# Patient Record
Sex: Male | Born: 1955 | Hispanic: Yes | Marital: Single | Smoking: Never smoker
Health system: Southern US, Community
[De-identification: ages and names within clinical notes are randomized; demographics above are authoritative.]

## PROBLEM LIST (undated history)

## (undated) DIAGNOSIS — E119 Type 2 diabetes mellitus without complications: Secondary | ICD-10-CM

---

## 2013-02-21 ENCOUNTER — Inpatient Hospital Stay (HOSPITAL_COMMUNITY)
Admission: EM | Admit: 2013-02-21 | Discharge: 2013-02-23 | DRG: 639 | Disposition: A | Discharge status: Home / Self Care | Payer: Self-pay | Attending: Infectious Disease | Admitting: Infectious Disease

## 2013-02-21 ENCOUNTER — Encounter (HOSPITAL_COMMUNITY): Payer: Self-pay | Admitting: Emergency Medicine

## 2013-02-21 ENCOUNTER — Emergency Department (INDEPENDENT_AMBULATORY_CARE_PROVIDER_SITE_OTHER)
Admission: EM | Admit: 2013-02-21 | Discharge: 2013-02-21 | Disposition: A | Discharge status: Home / Self Care | Payer: Self-pay | Source: Home / Self Care | Attending: Emergency Medicine | Admitting: Emergency Medicine

## 2013-02-21 ENCOUNTER — Emergency Department (INDEPENDENT_AMBULATORY_CARE_PROVIDER_SITE_OTHER): Payer: Self-pay

## 2013-02-21 DIAGNOSIS — L989 Disorder of the skin and subcutaneous tissue, unspecified: Secondary | ICD-10-CM | POA: Diagnosis present

## 2013-02-21 DIAGNOSIS — L03115 Cellulitis of right lower limb: Secondary | ICD-10-CM | POA: Diagnosis present

## 2013-02-21 DIAGNOSIS — E1169 Type 2 diabetes mellitus with other specified complication: Secondary | ICD-10-CM

## 2013-02-21 DIAGNOSIS — E1165 Type 2 diabetes mellitus with hyperglycemia: Secondary | ICD-10-CM | POA: Diagnosis present

## 2013-02-21 DIAGNOSIS — IMO0002 Reserved for concepts with insufficient information to code with codable children: Principal | ICD-10-CM | POA: Diagnosis present

## 2013-02-21 DIAGNOSIS — IMO0001 Reserved for inherently not codable concepts without codable children: Secondary | ICD-10-CM

## 2013-02-21 DIAGNOSIS — L97509 Non-pressure chronic ulcer of other part of unspecified foot with unspecified severity: Secondary | ICD-10-CM

## 2013-02-21 DIAGNOSIS — L02619 Cutaneous abscess of unspecified foot: Secondary | ICD-10-CM

## 2013-02-21 DIAGNOSIS — E119 Type 2 diabetes mellitus without complications: Secondary | ICD-10-CM

## 2013-02-21 DIAGNOSIS — S98919A Complete traumatic amputation of unspecified foot, level unspecified, initial encounter: Secondary | ICD-10-CM

## 2013-02-21 DIAGNOSIS — L97409 Non-pressure chronic ulcer of unspecified heel and midfoot with unspecified severity: Secondary | ICD-10-CM

## 2013-02-21 DIAGNOSIS — L0390 Cellulitis, unspecified: Secondary | ICD-10-CM

## 2013-02-21 DIAGNOSIS — L089 Local infection of the skin and subcutaneous tissue, unspecified: Secondary | ICD-10-CM

## 2013-02-21 DIAGNOSIS — Z794 Long term (current) use of insulin: Secondary | ICD-10-CM

## 2013-02-21 DIAGNOSIS — R739 Hyperglycemia, unspecified: Secondary | ICD-10-CM

## 2013-02-21 DIAGNOSIS — E11628 Type 2 diabetes mellitus with other skin complications: Secondary | ICD-10-CM

## 2013-02-21 DIAGNOSIS — E11621 Type 2 diabetes mellitus with foot ulcer: Secondary | ICD-10-CM

## 2013-02-21 DIAGNOSIS — L03119 Cellulitis of unspecified part of limb: Secondary | ICD-10-CM

## 2013-02-21 HISTORY — DX: Type 2 diabetes mellitus without complications: E11.9

## 2013-02-21 LAB — COMPREHENSIVE METABOLIC PANEL
ALT: 15 U/L (ref 0–53)
AST: 14 U/L (ref 0–37)
Albumin: 3.5 g/dL (ref 3.5–5.2)
Alkaline Phosphatase: 116 U/L (ref 39–117)
BILIRUBIN TOTAL: 0.4 mg/dL (ref 0.3–1.2)
BUN: 19 mg/dL (ref 6–23)
CHLORIDE: 99 meq/L (ref 96–112)
CO2: 24 meq/L (ref 19–32)
CREATININE: 0.88 mg/dL (ref 0.50–1.35)
Calcium: 9.4 mg/dL (ref 8.4–10.5)
GFR calc Af Amer: >90 mL/min (ref >90)
Glucose, Bld: 438 mg/dL — ABNORMAL HIGH (ref 70–99)
Potassium: 4.4 mEq/L (ref 3.7–5.3)
Sodium: 138 mEq/L (ref 137–147)
Total Protein: 7.5 g/dL (ref 6.0–8.3)

## 2013-02-21 LAB — CBC WITH DIFFERENTIAL/PLATELET
Basophils Absolute: 0 10*3/uL (ref 0.0–0.1)
Basophils Relative: 0 % (ref 0–1)
Eosinophils Absolute: 0.1 10*3/uL (ref 0.0–0.7)
Eosinophils Relative: 1 % (ref 0–5)
HEMATOCRIT: 40.7 % (ref 39.0–52.0)
HEMOGLOBIN: 14.4 g/dL (ref 13.0–17.0)
LYMPHS PCT: 19 % (ref 12–46)
Lymphs Abs: 1.6 10*3/uL (ref 0.7–4.0)
MCH: 29.4 pg (ref 26.0–34.0)
MCHC: 35.4 g/dL (ref 30.0–36.0)
MCV: 83.2 fL (ref 78.0–100.0)
MONO ABS: 0.4 10*3/uL (ref 0.1–1.0)
MONOS PCT: 4 % (ref 3–12)
Neutro Abs: 6 10*3/uL (ref 1.7–7.7)
Neutrophils Relative %: 75 % (ref 43–77)
Platelets: 257 10*3/uL (ref 150–400)
RBC: 4.89 MIL/uL (ref 4.22–5.81)
RDW: 12.9 % (ref 11.5–15.5)
WBC: 8.1 10*3/uL (ref 4.0–10.5)

## 2013-02-21 LAB — POCT I-STAT, CHEM 8
BUN: 20 mg/dL (ref 6–23)
CALCIUM ION: 1.23 mmol/L (ref 1.12–1.23)
CHLORIDE: 100 meq/L (ref 96–112)
Creatinine, Ser: 1 mg/dL (ref 0.50–1.35)
Glucose, Bld: 469 mg/dL — ABNORMAL HIGH (ref 70–99)
HCT: 46 % (ref 39.0–52.0)
Hemoglobin: 15.6 g/dL (ref 13.0–17.0)
Potassium: 4.5 mEq/L (ref 3.7–5.3)
Sodium: 139 mEq/L (ref 137–147)
TCO2: 27 mmol/L (ref 0–100)

## 2013-02-21 LAB — GLUCOSE, CAPILLARY
GLUCOSE-CAPILLARY: 371 mg/dL — AB (ref 70–99)
GLUCOSE-CAPILLARY: 155 mg/dL — AB (ref 70–99)
Glucose-Capillary: 355 mg/dL — ABNORMAL HIGH (ref 70–99)
Glucose-Capillary: 321 mg/dL — ABNORMAL HIGH (ref 70–99)
Glucose-Capillary: 95 mg/dL (ref 70–99)

## 2013-02-21 LAB — HEMOGLOBIN A1C
HEMOGLOBIN A1C: 11.2 % — AB (ref <5.7)
MEAN PLASMA GLUCOSE: 275 mg/dL — AB (ref <117)

## 2013-02-21 MED ORDER — ENOXAPARIN SODIUM 40 MG/0.4ML ~~LOC~~ SOLN
40.0000 mg | SUBCUTANEOUS | Status: DC
  Administered 2013-02-21 – 2013-02-22 (×2): 40 mg via SUBCUTANEOUS
  Filled 2013-02-21 (×3): qty 0.4

## 2013-02-21 MED ORDER — SODIUM CHLORIDE 0.9 % IV BOLUS (SEPSIS)
1000.0000 mL | Freq: Once | INTRAVENOUS | Status: AC
  Administered 2013-02-21: 1000 mL via INTRAVENOUS

## 2013-02-21 MED ORDER — LEVOFLOXACIN IN D5W 750 MG/150ML IV SOLN
750.0000 mg | Freq: Once | INTRAVENOUS | Status: AC
  Administered 2013-02-21: 750 mg via INTRAVENOUS
  Filled 2013-02-21: qty 150

## 2013-02-21 MED ORDER — INSULIN ASPART 100 UNIT/ML ~~LOC~~ SOLN
12.0000 [IU] | Freq: Once | SUBCUTANEOUS | Status: AC
  Administered 2013-02-21: 12 [IU] via SUBCUTANEOUS
  Filled 2013-02-21: qty 1

## 2013-02-21 MED ORDER — INSULIN ASPART 100 UNIT/ML ~~LOC~~ SOLN
0.0000 [IU] | Freq: Three times a day (TID) | SUBCUTANEOUS | Status: DC
  Administered 2013-02-22: 3 [IU] via SUBCUTANEOUS

## 2013-02-21 MED ORDER — INSULIN GLARGINE 100 UNIT/ML ~~LOC~~ SOLN
15.0000 [IU] | Freq: Every day | SUBCUTANEOUS | Status: DC
  Administered 2013-02-21 – 2013-02-22 (×2): 15 [IU] via SUBCUTANEOUS
  Filled 2013-02-21 (×3): qty 0.15

## 2013-02-21 NOTE — Discharge Instructions (Signed)
We have determined that your problem requires further evaluation in the emergency department.  We will take care of your transport there.  Once at the emergency department, you will be evaluated by a provider and they will order whatever treatment or tests they deem necessary.  We cannot guarantee that they will do any specific test or do any specific treatment.  ° °

## 2013-02-21 NOTE — ED Provider Notes (Signed)
Chief Complaint   Chief Complaint  Patient presents with  . Leg Pain    History of Present Illness   Eric Waller is a 58 year old Hispanic male who speaks little English and the history was obtained with the help of a telephone interpreter. He has just moved here from [place] about a month ago and does not have a primary care physician in the area. He has had a history of diabetes and takes insulin twice a day. He's not sure what kind of insulin he takes or the dose. He thinks it's 5 units twice a day. The past week he's had a painful, ulcerated lesion on his left little toe. This is been draining some fluid. He's felt chilled but not had any fever. He denies any trauma to the area. 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  Review of Systems   Other than as noted above, the patient denies any of the following symptoms: Systemic:  No fevers, chills, or sweats.  No fatigue or tiredness. Musculoskeletal:  No joint pain, arthritis, bursitis, swelling, or back pain.  Neurological:  No muscular weakness, paresthesias.   PMFSH   Past medical history, family history, social history, meds, and allergies were reviewed.   He takes insulin for his diabetes. He thinks his sugars have been under good control.  Physical  Examination     Vital signs:  BP 123/83  Pulse 82  Temp(Src) 97.8 F (36.6 C) (Oral)  Resp 16  SpO2 100% Gen:  Alert and oriented times 3.  In no distress. Musculoskeletal:  Exam of the foot reveals exam of the foot reveals a small ulcer at the base of the right little toe with some surrounding blistering and slight erythema. This is nontender to palpation. There is some serosanguineous drainage. The dorsum of the foot is slightly swollen. Pedal pulses were not palpable.  Otherwise, all joints had a full a ROM with no swelling, bruising or deformity.  No edema, pulses full. Extremities were warm and pink.  Capillary  refill was brisk.  Skin:  Clear, warm and dry.  No rash. Neuro:  Alert and oriented times 3.  Muscle strength was normal.  Sensation was intact to light touch.    Labs   Results for orders placed during the hospital encounter of 02/21/13  POCT I-STAT, CHEM 8      Result Value Range   Sodium 139  137 - 147 mEq/L   Potassium 4.5  3.7 - 5.3 mEq/L   Chloride 100  96 - 112 mEq/L   BUN 20  6 - 23 mg/dL   Creatinine, Ser 4.091.00  0.50 - 1.35 mg/dL   Glucose, Bld 811469 (*) 70 - 99 mg/dL   Calcium, Ion 9.141.23  7.821.12 - 1.23 mmol/L   TCO2 27  0 - 100 mmol/L   Hemoglobin 15.6  13.0 - 17.0 g/dL   HCT 95.646.0  21.339.0 - 08.652.0 %   Radiology   Dg Foot Complete Right  02/21/2013   CLINICAL DATA:  Infected ulcer.  EXAM: RIGHT FOOT COMPLETE - 3+ VIEW  COMPARISON:  None.  FINDINGS: Small bubbles of subcutaneous gas noted lateral to the proximal interphalangeal joint little toe. No cortical destruction or other bone abnormality. The DIP joint is fused, an anatomic variant. Normal alignment and mineralization. Extensive arterial vascular calcifications. No fracture. Small calcaneal spur.  IMPRESSION: 1. Subcutaneous gas lateral to the little toe PIP joint,  without associated bone abnormality.   Electronically Signed   By: Oley Balm M.D.   On: 02/21/2013 12:15   I reviewed the images independently and personally and concur with the radiologist's findings.  Assessment   The encounter diagnosis was Diabetic foot ulcer.  He has a diabetic foot ulcer, but what concerns me is his diabetes is not well controlled, he has no primary care support in town here, and his x-ray shows subcutaneous gas, concerning for gangrenous infection. He already has lost all the toes on his left foot, and I'm concerned that the same thing might happen on his right foot.  Plan   The patient was transferred to the ED via shuttle in stable condition.  Medical Decision Making     58 year old Hispanic male with diabetes presents with 1  week history of an infected ulcer on his right little toe.  He  Has not had much pain.  On exam, pulses are absent.  He has a blister and an ulcer on right little toe with surrounding erythema and sero-sanguinous drainage.  A culture has been obtained.  His x-ray show gas in the soft tissue and a glucose of 496 on iStat 8.  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  Will send by shuttle.      Reuben Likes, MD 02/21/13 [phone]

## 2013-02-21 NOTE — ED Provider Notes (Addendum)
CSN: [id number]     Arrival date & time 02/21/13  1333 History   First MD Initiated Contact with Patient 02/21/13 1352     Chief Complaint  Patient presents with  . Recurrent Skin Infections   (Consider location/radiation/quality/duration/timing/severity/associated sxs/prior Treatment) The history is provided by the patient.  pt w hx iddm, presents from urgent care w blister/wound to dorsal/lateral aspect right 5th toe for the past few days. Symptoms constant, localized, without specific exacerbating or alleviating factors.  Was also noted to have blood glucose in 400s at urgent care. No nausea/vomiting. States compliant w his insulin rx, but isnt sure of type or dose. No fever or chills. No spreading redness up foot or leg.      Past Medical History  Diagnosis Date  . Diabetes mellitus without complication    History reviewed. No pertinent past surgical history. History reviewed. No pertinent family history. History  Substance Use Topics  . Smoking status: Never Smoker   . Smokeless tobacco: Not on file  . Alcohol Use: No    Review of Systems  Constitutional: Negative for fever.  HENT: Negative for sore throat.   Eyes: Negative for redness.  Respiratory: Negative for shortness of breath.   Cardiovascular: Negative for chest pain.  Gastrointestinal: Negative for vomiting and abdominal pain.  Genitourinary: Negative for flank pain.  Musculoskeletal: Negative for back pain and neck pain.  Skin: Negative for rash.  Neurological: Negative for headaches.  Hematological: Does not bruise/bleed easily.  Psychiatric/Behavioral: Negative for confusion.    Allergies  Review of patient's allergies indicates no known allergies.  Home Medications  No current outpatient prescriptions on file. BP 164/92  Pulse 80  Temp(Src) 97.8 F (36.6 C) (Oral)  Resp 16  Wt 137 lb 9.6 oz (62.415 kg)  SpO2 98% Physical Exam  Nursing note and vitals reviewed. Constitutional: He appears  well-developed and well-nourished. No distress.  HENT:  Head: Atraumatic.  Mouth/Throat: Oropharynx is clear and moist.  Eyes: Pupils are equal, round, and reactive to light.  Neck: Neck supple. No tracheal deviation present.  Cardiovascular: Normal rate, regular rhythm, normal heart sounds and intact distal pulses.   Pulmonary/Chest: Effort normal and breath sounds normal. No accessory muscle usage. No respiratory distress.  Abdominal: Soft. Bowel sounds are normal. He exhibits no distension. There is no tenderness.  Musculoskeletal: Normal range of motion. He exhibits no edema.  Superficial ruptured/broken blister to dorsal/lat aspect right 5th toe w mild surrounding erythema. No pus. No crepitus. Normal cap refill distally. Dp/pt palp. No necrotic/gangrenous changes to toe or foot.   Neurological: He is alert.  Skin: Skin is warm and dry. He is not diaphoretic.  Psychiatric: He has a normal mood and affect.    ED Course  Procedures (including critical care time)   I-STAT, chem 8   Status: Final result Visible to patient: This result is not viewable by the patient. Next appt: None           Range 12:16 PM    Sodium 137 - 147 mEq/L 139     Potassium 3.7 - 5.3 mEq/L 4.5     Chloride 96 - 112 mEq/L 100     BUN 6 - 23 mg/dL 20     Creatinine, Ser 0.50 - 1.35 mg/dL 1.19     Glucose, Bld 70 - 99 mg/dL 147 (H)     Calcium, Ion 1.12 - 1.23 mmol/L 1.23     TCO2 0 - 100 mmol/L 27  Hemoglobin 13.0 - 17.0 g/dL 82.915.6     HCT 56.239.0 - 13.052.0 % 46.0     Resulting Agency        Results for orders placed during the hospital encounter of 02/21/13  GLUCOSE, CAPILLARY      Result Value Range   Glucose-Capillary 371 (*) 70 - 99 mg/dL   Dg Foot Complete Right  02/21/2013   CLINICAL DATA:  Infected ulcer.  EXAM: RIGHT FOOT COMPLETE - 3+ VIEW  COMPARISON:  None.  FINDINGS: Small bubbles of subcutaneous gas noted lateral to the proximal interphalangeal joint little toe. No cortical  destruction or other bone abnormality. The DIP joint is fused, an anatomic variant. Normal alignment and mineralization. Extensive arterial vascular calcifications. No fracture. Small calcaneal spur.  IMPRESSION: 1. Subcutaneous gas lateral to the little toe PIP joint, without associated bone abnormality.   Electronically Signed   By: Oley Balmaniel  Hassell M.D.   On: 02/21/2013 12:15      EKG Interpretation   None       MDM  Iv ns bolus. levaquin iv.  Reviewed xr from [facility]  -  Pt has an open/ruptured blister on toe in area of tiny amt subcut air (i.e. Feel likely that subcut air noted on xr is from environmental source, as opposed to be generated for aggressive soft tissue infxn).   There is no gangrenous changes to foot or toe, but rather ruptured blister w surrounding erythema.  Erythema extends up dorsum foot w streaking to ankle.   Given infected diabetic foot wound, cellulitis, uncontrolled diabetes, w very limited local resources for home care, abx, management of diabetes, follow up, etc, med service called to admit for iv abx.  Insulin sq. Additional ivf.  Discussed w teaching service resident, will admit.      Suzi RootsKevin E Amila Callies, MD 02/21/13 [phone]

## 2013-02-21 NOTE — ED Notes (Addendum)
[facility] of diabetic ulcer to r foot and hyperglycemia. Labs, xray and wound culture were obtained at [facility]

## 2013-02-21 NOTE — ED Notes (Signed)
CBG Taken = 371.

## 2013-02-21 NOTE — H&P (Signed)
Date: 02/21/2013               Patient Name:  Eric Waller A Ostrosky MRN: [id number]  DOB: 10/08/1955 Age / Sex: 58 y.o., male   PCP: No primary provider on file.         Medical Service: Internal Medicine Teaching Service         Attending Physician: Dr. Inez CatalinaEmily B Mullen, MD    First Contact: Dr. Alvina ChouJacqueline Gill Pager: [phone]  Second Contact: Dr. Dow Adolphichard Kazibwe Pager: [phone]       After Hours (After 5p/  First Contact Pager: [phone]  weekends / holidays): Second Contact Pager: [phone]   Chief Complaint: Foot ulcer  History of Present Illness:  Eric Waller is a 58 y.o. Spanish-speaking male PMH IDDM, recently moved to [place], who presents to the ED with a chief complaint of foot ulcer. A telephone translator was used.  Patient has a blister on the dorsolateral aspect of his right fifth toe. He says it has been present for the past few days. He had ulcer like this once before, on his left foot, about 5 months ago after he stepped on a nail. 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  He tells me he has had diabetes for about 10 years. He has never been hospitalized for DKA. He says he is compliant with his insulin prescription, and that this is his only medication. He tells me he takes 5 units twice a day but does not know what kind of insulin it is. He injects into his thighs. When I asked him who was prescribing him his insulin, he said [facility] in [place]. He moved to [place] about one month ago. His son lives here. He does not have a primary care doctor. He used to work [employer] in [place], but has not worked for 8 months. He does not smoke, drink, or use drugs.  Patient denies fevers, chills, abdominal pain, nausea, vomiting, chest pain, shortness of breath.  In the ED, he was given NovoLog 12 units subq, Levaquin 750 mg IV x1, and a 1 L normal saline fluid  bolus   Meds: No current facility-administered medications for this encounter.   No current outpatient prescriptions on file.    Allergies: Allergies as of 02/21/2013  . (No Known Allergies)   Past Medical History  Diagnosis Date  . Diabetes mellitus without complication    History reviewed. No pertinent past surgical history. History reviewed. No pertinent family history. History   Social History  . Marital Status: Single    Spouse Name: N/A    Number of Children: N/A  . Years of Education: N/A   Occupational History  . Not on file.   Social History Main Topics  . Smoking status: Never Smoker   . Smokeless tobacco: Not on file  . Alcohol Use: No  . Drug Use: No  . Sexual Activity: Not on file   Other Topics Concern  . Not on file   Social History Narrative  . No narrative on file    Review of Systems: Pertinent items are noted in HPI.  Physical Exam: Blood pressure 136/75, pulse 73, temperature 97.8 F (36.6 C), temperature source Oral, resp. rate 16, weight 137 lb 9.6 oz (62.415 kg), SpO2 99.00%. Physical Exam  Constitutional: He is oriented to person, place, and time and well-developed, well-nourished, and in no distress.  HENT:  Head: Normocephalic and atraumatic.  Eyes: Conjunctivae and EOM are normal. Pupils are equal, round, and reactive to light.  Neck: Normal range of motion. Neck supple.  Cardiovascular: Normal rate, regular rhythm, normal heart sounds and intact distal pulses.  Exam reveals no gallop and no friction rub.   No murmur heard. Pulmonary/Chest: Effort normal and breath sounds normal. No respiratory distress. He has no wheezes. He has no rales. He exhibits no tenderness.  Abdominal: Soft. Bowel sounds are normal. He exhibits no distension. There is no tenderness.  Musculoskeletal: Normal range of motion. He exhibits no edema and no tenderness.  Neurological: He is alert and oriented to person, place, and time. GCS score is 15.  Skin:  Skin is warm and dry. Lesion (Multiple dark skin lesions on legs and chest/shoulders) noted.     Psychiatric: Affect normal.     Lab results: Basic Metabolic Panel:  Recent Labs  16/10/96 1216 02/21/13 1446  NA 139 138  K 4.5 4.4  CL 100 99  CO2  --  24  GLUCOSE 469* 438*  BUN 20 19  CREATININE 1.00 0.88  CALCIUM  --  9.4   Liver Function Tests:  Recent Labs  02/21/13 1446  AST 14  ALT 15  ALKPHOS 116  BILITOT 0.4  PROT 7.5  ALBUMIN 3.5   No results found for this basename: LIPASE, AMYLASE,  in the last 72 hours No results found for this basename: AMMONIA,  in the last 72 hours CBC:  Recent Labs  02/21/13 1216 02/21/13 1446  WBC  --  8.1  NEUTROABS  --  6.0  HGB 15.6 14.4  HCT 46.0 40.7  MCV  --  83.2  PLT  --  257   Cardiac Enzymes: No results found for this basename: CKTOTAL, CKMB, CKMBINDEX, TROPONINI,  in the last 72 hours BNP: No results found for this basename: PROBNP,  in the last 72 hours D-Dimer: No results found for this basename: DDIMER,  in the last 72 hours CBG:  Recent Labs  02/21/13 1345 02/21/13 1515 02/21/13 1613  GLUCAP 371* 355* 321*   Hemoglobin A1C: No results found for this basename: HGBA1C,  in the last 72 hours Fasting Lipid Panel: No results found for this basename: CHOL, HDL, LDLCALC, TRIG, CHOLHDL, LDLDIRECT,  in the last 72 hours Thyroid Function Tests: No results found for this basename: TSH, T4TOTAL, FREET4, T3FREE, THYROIDAB,  in the last 72 hours Anemia Panel: No results found for this basename: VITAMINB12, FOLATE, FERRITIN, TIBC, IRON, RETICCTPCT,  in the last 72 hours Coagulation: No results found for this basename: LABPROT, INR,  in the last 72 hours Urine Drug Screen: Drugs of Abuse  No results found for this basename: labopia,  cocainscrnur,  labbenz,  amphetmu,  thcu,  labbarb    Alcohol Level: No results found for this basename: ETH,  in the last 72 hours Urinalysis: No results found for this  basename: COLORURINE, APPERANCEUR, LABSPEC, PHURINE, GLUCOSEU, HGBUR, BILIRUBINUR, KETONESUR, PROTEINUR, UROBILINOGEN, NITRITE, LEUKOCYTESUR,  in the last 72 hours   Imaging results:  Dg Foot Complete Right  02/21/2013   CLINICAL DATA:  Infected ulcer.  EXAM: RIGHT FOOT COMPLETE - 3+ VIEW  COMPARISON:  None.  FINDINGS: Small bubbles of subcutaneous gas noted lateral to the proximal interphalangeal joint little toe. No cortical destruction or other bone abnormality. The DIP joint is fused, an anatomic variant. Normal alignment and mineralization. Extensive arterial vascular calcifications. No fracture. Small calcaneal spur.  IMPRESSION:  1. Subcutaneous gas lateral to the little toe PIP joint, without associated bone abnormality.   Electronically Signed   By: Oley Balm M.D.   On: 02/21/2013 12:15    Other results: EKG: Not performed.  Assessment & Plan by Problem: Jenesis A Talton is a 58 y.o. Spanish-speaking male PMH IDDM, recently moved to [place], who presents to the ED with a chief complaint of foot ulcer.  #Diabetic foot ulcer with mild severity infection - Minimal erythema, no induration or discharge. Extension through skin only. Do not suspect osteomyelitis. No signs of systemic infection. WBC 8.1, no left shift. Patient is afebrile, all vital signs stable. Subcutaneous air was noted on XR, associated with the blister. Based on our exam and his well-appearing clinical picture, this air is likely from an environmental source instead of necrotizing fasciitis/etc, but we will monitor him closely for this.  - Admit to IMTS, med-surg - Changing antibiotic regimen to Clindamycin 300mg  po q8h, doxycycline 100mg  po BID for empiric treatment of mild diabetic foot infection, this covers streptococci and MRSA  - Wound care consult - Carb modified diet - CMP, CBC in am  #IDDM - Likely type 2 diabetes given adult onset. 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 I attempted to  obtain records from [facility]. They're on Care Everywhere, but no matching patients were found when I queried his information. We can try to fax a requisition form to their medical records department at fax# [phone]. I called and they are open on the weekend. - Obtain records from [facility] (nursing order placed) - Hemoglobin A1c - Social work consult to assist with limited resources - Care management consult for medication needs and followup - Diabetes educator consult  #Skin lesions - Patient with multiple, heterogenous lesions on skin. He denies insect bites. - HIV antibody  #DVT PXX - Lovenox subcutaneous  Dispo: Disposition is deferred at this time, awaiting improvement of current medical problems. Anticipated discharge in approximately 1-3 day(s).   The patient does not have a current PCP (No primary provider on file.) and does not need an [facility] hospital follow-up appointment after discharge.  The patient does not have transportation limitations that hinder transportation to clinic appointments.  Signed: Vivi Barrack, MD 02/21/2013, 4:52 PM

## 2013-02-21 NOTE — ED Notes (Signed)
C/o right pinky toe ulcer due to shoe rubbing up against his foot

## 2013-02-22 DIAGNOSIS — IMO0001 Reserved for inherently not codable concepts without codable children: Secondary | ICD-10-CM

## 2013-02-22 DIAGNOSIS — E1165 Type 2 diabetes mellitus with hyperglycemia: Secondary | ICD-10-CM

## 2013-02-22 LAB — CBC
HCT: 38.8 % — ABNORMAL LOW (ref 39.0–52.0)
Hemoglobin: 13.4 g/dL (ref 13.0–17.0)
MCH: 28.9 pg (ref 26.0–34.0)
MCHC: 34.5 g/dL (ref 30.0–36.0)
MCV: 83.6 fL (ref 78.0–100.0)
Platelets: 239 10*3/uL (ref 150–400)
RBC: 4.64 MIL/uL (ref 4.22–5.81)
RDW: 13 % (ref 11.5–15.5)
WBC: 8.4 10*3/uL (ref 4.0–10.5)

## 2013-02-22 LAB — COMPREHENSIVE METABOLIC PANEL
ALT: 13 U/L (ref 0–53)
AST: 14 U/L (ref 0–37)
Albumin: 2.8 g/dL — ABNORMAL LOW (ref 3.5–5.2)
Alkaline Phosphatase: 98 U/L (ref 39–117)
BUN: 21 mg/dL (ref 6–23)
CALCIUM: 8.7 mg/dL (ref 8.4–10.5)
CO2: 23 meq/L (ref 19–32)
CREATININE: 0.9 mg/dL (ref 0.50–1.35)
Chloride: 104 mEq/L (ref 96–112)
GFR calc Af Amer: >90 mL/min (ref >90)
Glucose, Bld: 254 mg/dL — ABNORMAL HIGH (ref 70–99)
Potassium: 3.9 mEq/L (ref 3.7–5.3)
Sodium: 140 mEq/L (ref 137–147)
Total Bilirubin: 0.3 mg/dL (ref 0.3–1.2)
Total Protein: 6.6 g/dL (ref 6.0–8.3)

## 2013-02-22 LAB — GLUCOSE, CAPILLARY
GLUCOSE-CAPILLARY: 102 mg/dL — AB (ref 70–99)
GLUCOSE-CAPILLARY: 346 mg/dL — AB (ref 70–99)
GLUCOSE-CAPILLARY: 91 mg/dL (ref 70–99)
Glucose-Capillary: 215 mg/dL — ABNORMAL HIGH (ref 70–99)

## 2013-02-22 LAB — HIV ANTIBODY (ROUTINE TESTING W REFLEX): HIV: NONREACTIVE

## 2013-02-22 MED ORDER — INSULIN ASPART 100 UNIT/ML ~~LOC~~ SOLN
0.0000 [IU] | Freq: Three times a day (TID) | SUBCUTANEOUS | Status: DC
  Administered 2013-02-22: 11 [IU] via SUBCUTANEOUS

## 2013-02-22 MED ORDER — CLINDAMYCIN HCL 300 MG PO CAPS
300.0000 mg | ORAL_CAPSULE | Freq: Three times a day (TID) | ORAL | Status: DC
  Administered 2013-02-22 – 2013-02-23 (×4): 300 mg via ORAL
  Filled 2013-02-22 (×7): qty 1

## 2013-02-22 MED ORDER — INSULIN ASPART 100 UNIT/ML ~~LOC~~ SOLN
0.0000 [IU] | Freq: Three times a day (TID) | SUBCUTANEOUS | Status: DC
  Administered 2013-02-23: 8 [IU] via SUBCUTANEOUS
  Administered 2013-02-23: 5 [IU] via SUBCUTANEOUS

## 2013-02-22 NOTE — Discharge Summary (Signed)
Name: Eric Waller MRN: [id number] DOB: 03/16/1955 58 y.o. PCP: No primary provider on file. . Date of Admission: 02/21/2013  1:51 PM Date of Discharge: 02/22/2013 Attending Physician: Inez CatalinaEmily B Mullen, MD  Discharge Diagnosis: Principal Problem:   Cellulitis of right foot Active Problems:   Diabetes type 2, uncontrolled  Discharge Medications:   Medication List         clindamycin 300 MG capsule  Commonly known as:  CLEOCIN  Take 1 capsule (300 mg total) by mouth every 8 (eight) hours.     insulin NPH-regular Human (70-30) 100 UNIT/ML injection  Commonly known as:  NOVOLIN 70/30  Inject 15 Units into the skin 2 (two) times daily at 8 am and 10 pm.     Insulin Syringe-Needle U-100 27G X 1/2" 0.5 ML Misc  3 Packages by Does not apply route 2 (two) times daily.        Disposition and follow-up:   [name] Good [facility].  Please address the following problems:  #BP- pt BP elevated and should consider placing on ACE-i. Goal BP<140/90.   Recheck of 5th right toe ulcer  #Labs / imaging needed at time of follow-up: HA1c in 3 months  #Pending labs/ test needing follow-up: None  Follow-up Appointments: Follow-up Information   Please follow up. [facility]  [address]  [place]  [phone]   must register at clinic before scheduled for appointment  Bring proof of income, photo ID, bill with house address )       Discharge Instructions: Discharge Orders   Future Orders Complete By Expires   Call MD for:  difficulty breathing, headache or visual disturbances  As directed    Call MD for:  persistant nausea and vomiting  As directed    Call MD for:  redness, tenderness, or signs of infection (pain, swelling, redness, odor or green/yellow discharge around incision site)  As directed    Call MD for:  severe uncontrolled pain  As directed    Call MD for:  temperature >100.4  As  directed    Diet - low sodium heart healthy  As directed    Increase activity slowly  As directed       Consultations:  None  Procedures Performed:  Dg Foot Complete Right  02/21/2013   CLINICAL DATA:  Infected ulcer.  EXAM: RIGHT FOOT COMPLETE - 3+ VIEW  COMPARISON:  None.  FINDINGS: Small bubbles of subcutaneous gas noted lateral to the proximal interphalangeal joint little toe. No cortical destruction or other bone abnormality. The DIP joint is fused, an anatomic variant. Normal alignment and mineralization. Extensive arterial vascular calcifications. No fracture. Small calcaneal spur.  IMPRESSION: 1. Subcutaneous gas lateral to the little toe PIP joint, without associated bone abnormality.   Electronically Signed   By: Oley Balmaniel  Hassell M.D.   On: 02/21/2013 12:15    2D Echo: N/A  Cardiac Cath: N/A  Admission HPI: Eric Waller is a 58 y.o. Spanish-speaking male PMH IDDM, [place], who presents to the ED with a chief complaint of foot ulcer. A telephone translator was used.  Patient has a blister on the dorsolateral aspect of his right fifth toe. He says it has been present for the past few days. He had ulcer like this once before, on his left foot, about 5 months ago after he stepped on a nail. 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  He tells me he has had diabetes for about 10 years. He has never been hospitalized for DKA. He says he is compliant with his insulin prescription, and that this is his only medication. He tells me he takes 5 units twice a day but does not know what kind of insulin it is. He injects into his thighs. When I asked him who was prescribing him his insulin, he said [facility] in [place]. He moved to [place] about one month ago. His son lives here. He does not have a primary care doctor. He used to work [employer] in [place], but has  not worked for 8 months. He does not smoke, drink, or use drugs.  Patient denies fevers, chills, abdominal pain, nausea, vomiting, chest pain, shortness of breath.   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 Extension through skin only. Do not suspect osteomyelitis. No signs of systemic infection. WBC 8.1, no left shift. Patient is afebrile, all vital signs stable. Subcutaneous air was noted on XR, associated with the blister. Based on our exam and his well-appearing clinical picture, this air is likely from an environmental source instead of necrotizing fasciitis/etc, but we will monitor him closely for this.  Wound care was consulted and provided triple antibiotic daily.  He was started on clindamycin and doxycycline and was discharged on clindamycin for an additional 8 days.    #Uncontrolled DMII - Likely type 2 diabetes given adult onset. 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 I attempted to obtain records from [facility]. They're on Care Everywhere, but no matching patients were found when I queried his information. Hemoglobin A1c 02/21/13 was 11.2. Pt was started on 15 units lantus and SSI. He is unaware of the name of the insulin he is on at home.  Social work assisted in obtaining novolin 70/30 insulin through [employer] program and was sent home to inject 15 units twice daily at 8am and 10pm.  Diabetes educator consulted and provided pt with information and instructions regarding proper insulin use and education about monitoring carb intake.  He should have a recheck of HA1c in 3 months.   #Skin lesions - Patient with multiple, heterogenous lesions on skin. He denies insect  bites. HIV antibody negative.   #DVT PXX - Lovenox subcutaneous   Discharge Vitals:   BP 155/78  Pulse 74  Temp(Src) 98.2 F (36.8 C) (Oral)  Resp 18  Ht 5\' 5"  (1.651 m)  Wt 137 lb 9.1 oz (62.4 kg)  BMI 22.89 kg/m2  SpO2 99%  Discharge Labs:  No results found for this or any previous visit (from the past 24 hour(s)).  Signed: Boykin Peek, MD [phone] 02/26/2013, 9:16 PM   Time Spent on Discharge: Services Ordered on Discharge: None Equipment Ordered on Discharge: None

## 2013-02-22 NOTE — Progress Notes (Signed)
Subjective:   Pt has no new complaints this AM and is feeling better.  He did not have any acute events overnight.  Pt denies any pain, fever/chills, CP, SOB.   Objective:   Vital signs in last 24 hours: Filed Vitals:   02/21/13 2020 02/21/13 2055 02/22/13 0435 02/22/13 0900  BP: 121/72  140/84 129/74  Pulse: 67  79 73  Temp: 97.3 F (36.3 C)  98.1 F (36.7 C) 98 F (36.7 C)  TempSrc: Oral  Oral Oral  Resp: 16  16 19   Height:  5\' 5"  (1.651 m)    Weight:      SpO2: 99%  99% 100%   Weight change:  No intake or output data in the 24 hours ending 02/22/13 1121  Physical Exam: Constitutional: Vital signs reviewed.  Patient is in no acute distress and cooperative with exam.   Head: Normocephalic and atraumatic Eyes: PERRL, EOMI, conjunctivae normal, no scleral icterus  Neck: Supple, Trachea midline Cardiovascular: RRR, S1, S2 present, no MRG, DP 2+ b/l Pulmonary/Chest: normal respiratory effort, CTAB, no wheezes, rales, or rhonchi Abdominal: Thin. Soft. +BS, Non-tender, non-distended Neurological: A&O x3, cranial nerve II-XII are grossly intact, moving all extremities  Extremities: Right fifth toe lateral healing ulcer (0.5cm).  Left foot partial amputation.  Skin: Warm, dry and intact. Diffuse macular (0.5-1cm) round violaceous rash on the LE. Left anterior cervical scabbed over wound.     Psychiatric: Normal mood and affect.   Lab Results:  BMP:  Recent Labs Lab 02/21/13 1446 02/22/13 0500  NA 138 140  K 4.4 3.9  CL 99 104  CO2 24 23  GLUCOSE 438* 254*  BUN 19 21  CREATININE 0.88 0.90  CALCIUM 9.4 8.7    CBC:  Recent Labs Lab 02/21/13 1216 02/21/13 1446 02/22/13 0500  WBC  --  8.1 8.4  NEUTROABS  --  6.0  --   HGB 15.6 14.4 13.4  HCT 46.0 40.7 38.8*  MCV  --  83.2 83.6  PLT  --  257 239    Coagulation: No results found for this basename: LABPROT, INR,  in the last 168 hours  CBG:            Recent Labs Lab 02/21/13 1345 02/21/13 1515  02/21/13 1613 02/21/13 1731 02/21/13 2027 02/22/13 0728  GLUCAP 371* 355* 321* 95 155* 215*           HA1C:       Recent Labs Lab 02/21/13 1446  HGBA1C 11.2*    Lipid Panel: No results found for this basename: CHOL, HDL, LDLCALC, TRIG, CHOLHDL, LDLDIRECT,  in the last 168 hours  LFTs:  Recent Labs Lab 02/21/13 1446 02/22/13 0500  AST 14 14  ALT 15 13  ALKPHOS 116 98  BILITOT 0.4 0.3  PROT 7.5 6.6  ALBUMIN 3.5 2.8*    Pancreatic Enzymes: No results found for this basename: LIPASE, AMYLASE,  in the last 168 hours  Ammonia: No results found for this basename: AMMONIA,  in the last 168 hours  Cardiac Enzymes: No results found for this basename: CKTOTAL, CKMB, CKMBINDEX, TROPONINI,  in the last 168 hours No results found for this basename: CKTOTAL,  CKMB,  CKMBINDEX,  TROPONINI    EKG:  Date/Time:    Ventricular Rate:    PR Interval:    QRS Duration:   QT Interval:    QTC Calculation:   R Axis:     Text Interpretation:    BNP: No  results found for this basename: PROBNP,  in the last 168 hours  D-Dimer: No results found for this basename: DDIMER,  in the last 168 hours  Urinalysis: No results found for this basename: COLORURINE, APPERANCEUR, LABSPEC, PHURINE, GLUCOSEU, HGBUR, BILIRUBINUR, KETONESUR, PROTEINUR, UROBILINOGEN, NITRITE, LEUKOCYTESUR,  in the last 168 hours  Micro Results: Recent Results (from the past 240 hour(s))  WOUND CULTURE     Status: None   Collection Time    02/21/13 12:06 PM      Result Value Range Status   Specimen Description WOUND RIGHT TOE   Final   Special Requests Normal   Final   Gram Stain PENDING   Incomplete   Culture     Final   Value: Culture reincubated for better growth     Performed at [facility]   Report Status PENDING   Incomplete       Component Value Date/Time   SDES WOUND RIGHT TOE 02/21/2013 1206   SPECREQUEST Normal 02/21/2013 1206   CULT  Value: Culture reincubated for better growth  Performed at [facility] 02/21/2013 1206   REPTSTATUS PENDING 02/21/2013 1206    Studies/Results: Dg Foot Complete Right  02/21/2013   CLINICAL DATA:  Infected ulcer.  EXAM: RIGHT FOOT COMPLETE - 3+ VIEW  COMPARISON:  None.  FINDINGS: Small bubbles of subcutaneous gas noted lateral to the proximal interphalangeal joint little toe. No cortical destruction or other bone abnormality. The DIP joint is fused, an anatomic variant. Normal alignment and mineralization. Extensive arterial vascular calcifications. No fracture. Small calcaneal spur.  IMPRESSION: 1. Subcutaneous gas lateral to the little toe PIP joint, without associated bone abnormality.   Electronically Signed   By: Oley Balm M.D.   On: 02/21/2013 12:15    Medications:  Scheduled Meds: . clindamycin  300 mg Oral Q8H  . enoxaparin (LOVENOX) injection  40 mg Subcutaneous Q24H  . insulin aspart  0-15 Units Subcutaneous TID WC  . insulin glargine  15 Units Subcutaneous QHS   Continuous Infusions:  PRN Meds:.  Antibiotics: Anti-infectives   Start     Dose/Rate Route Frequency Ordered Stop   02/22/13 1100  clindamycin (CLEOCIN) capsule 300 mg     300 mg Oral 3 times per day 02/22/13 1010     02/21/13 1400  levofloxacin (LEVAQUIN) IVPB 750 mg     750 mg 100 mL/hr over 90 Minutes Intravenous  Once 02/21/13 1359 02/21/13 1704     Antibiotics Given (last 72 hours)   None      Day of Hospitalization:  1 Consults:    Assessment/Plan:   58 yo male with a PMH of uncontrolled DMII who comes in with a right fifth toe ulcer.  #Right Diabetic Foot Ulcer- Pt denies any pain, fever/chills; without leukocytosis.  On exam, right fifth toe has a 0.5cm healing ulcer without erythema, purulence, or warmth.  R foot XR is without bone abnormality.  -d/c doxycycline -continue clindamycin 300mg  tid  -appreciate wound consult -goal to get better control of bs  #Uncontrolled DMII- Pt currently on SSI-sensitive and lantus 15  units.   Lab Results  Component Value Date   HGBA1C 11.2* 02/21/2013   CBG (last 3)   Recent Labs  02/21/13 1731 02/21/13 2027 02/22/13 0728  GLUCAP 95 155* 215*   -d/c SSI-senstitive -add SSI-moderate -continue lantus 15 units at bedtime -will need diabetes education and close follow-up   #Dispo- Disposition is deferred at this time, awaiting improvement of current medical  problems.  Anticipated discharge in approximately 1-2 day(s).    LOS: 1 day   Boykin PeekJacquelyn Kerrie Latour, MD PGY-1, Internal Medicine  [phone] 02/22/2013, 11:21 AM

## 2013-02-22 NOTE — H&P (Signed)
  Date: 02/22/2013  Patient name: Eric Waller  Medical record number: [id number]  Date of birth: 12/13/1955   I have seen and evaluated Eric Waller and discussed their care with the Residency Team. Briefly, Eric Waller is a 58yo spanish speaking man who presents with a wound on his fifth toe of the right foot.  He has a history of DM for which he is treated with insulin.  [place] and has very limited medical history noted in our system.  He reports the wound started about a week ago as an ulcer which popped and has progressed.  He denies any fever, chills, nausea, vomiting, pain in the foot.  The wound is on the anterolateral edge of the little toe on the right with very little pus draining when I saw it.  He had palpable pulses bilaterally, 2+ in the DP and TP arteries.  Incidentally he also notes a many month history of dark coin like lesions on his legs and neck/back.  He was afebrile and hemodynamically stable on admission to the ED.  On labwork, he was noted to have glucoses around 200-300s.  He had an xray of the foot as noted in the resident note.    Assessment and Plan: I have seen and evaluated the patient as outlined above. I agree with the formulated Assessment and Plan as detailed in the residents' admission note, with the following changes:   1. Diabetic foot ulcer: Likely related to his diabetes, however, the site on the anterior portion of his toe could make vascular issues concerning.  Agree with antibiotics as noted in the resident note.  Could likely downgrade to a single antibiotic.  Wound care consult.   2. DM, insulin dependent: The patient is not sure what type of insulin he takes.  Will attempt to contact his family to find out what dose he is on at home.  In the interim, starting long acting with lantus and SSI.  Check Hgb A1C.  Will get SW/CM assistance to set up follow up.   3. Skin lesions: unclear etiology.  Will initiate work up  with an HIV antibody.    Other issues per resident note.   Inez CatalinaEmily B Kimble Delaurentis, MD 2/1/201511:13 AM

## 2013-02-23 DIAGNOSIS — L089 Local infection of the skin and subcutaneous tissue, unspecified: Secondary | ICD-10-CM

## 2013-02-23 LAB — WOUND CULTURE
Gram Stain: NONE SEEN
Special Requests: NORMAL

## 2013-02-23 LAB — GLUCOSE, CAPILLARY
GLUCOSE-CAPILLARY: 279 mg/dL — AB (ref 70–99)
Glucose-Capillary: 222 mg/dL — ABNORMAL HIGH (ref 70–99)

## 2013-02-23 MED ORDER — INSULIN ISOPHANE & REGULAR (HUMAN 70-30)100 UNIT/ML KWIKPEN: 15.0000 [IU] | PEN_INJECTOR | Freq: Two times a day (BID) | SUBCUTANEOUS | Status: DC

## 2013-02-23 MED ORDER — "INSULIN SYRINGE-NEEDLE U-100 27G X 1/2"" 0.5 ML MISC": 3.0000 | Freq: Two times a day (BID) | Status: AC

## 2013-02-23 MED ORDER — CLINDAMYCIN HCL 300 MG PO CAPS: 300.0000 mg | ORAL_CAPSULE | Freq: Three times a day (TID) | ORAL | Status: AC

## 2013-02-23 MED ORDER — INSULIN NPH ISOPHANE & REGULAR (70-30) 100 UNIT/ML ~~LOC~~ SUSP: 15.0000 [IU] | Freq: Two times a day (BID) | SUBCUTANEOUS | Status: AC

## 2013-02-23 MED ORDER — BACITRACIN-NEOMYCIN-POLYMYXIN OINTMENT TUBE
TOPICAL_OINTMENT | Freq: Every day | CUTANEOUS | Status: DC
  Administered 2013-02-23: 14:00:00 via TOPICAL
  Filled 2013-02-23: qty 15

## 2013-02-23 NOTE — Progress Notes (Signed)
Nutrition Brief Note  Patient identified on the Malnutrition Screening Tool (MST) Report. Currently eating well. Appears well-nourished.  Wt Readings from Last 15 Encounters:  02/21/13 137 lb 9.1 oz (62.4 kg)    Lab Results  Component Value Date   HGBA1C 11.2* 02/21/2013    RD provided "Carbohydrate Counting for People with Diabetes" (in [place]) handout from the Academy of Nutrition and Dietetics; used the interpreter at bedside. Discussed different food groups and their effects on blood sugar, emphasizing carbohydrate-containing foods. Provided list of carbohydrates and recommended serving sizes of common foods.  Discussed importance of controlled and consistent carbohydrate intake throughout the day. Provided examples of ways to balance meals/snacks and encouraged intake of high-fiber, whole grain complex carbohydrates. Teach back method used.  Expect fair compliance.  Body mass index is 22.89 kg/(m^2). Pt meets criteria for normal weight based on current BMI.  Current diet order is Carbohydrate Modified Medium, patient is consuming approximately 100% of meals at this time. Labs and medications reviewed. No further nutrition interventions warranted at this time. RD contact information provided. If additional nutrition issues arise, please re-consult RD.  Eric MottoSamantha Maysie Parkhill MS, RD, LDN Pager: [phone] After-hours pager: [phone]

## 2013-02-23 NOTE — Progress Notes (Signed)
   CARE MANAGEMENT NOTE 02/23/2013  Patient:  Eric Waller, Eric Waller   Account Number:  [id number]  Date Initiated:  02/23/2013  Documentation initiated by:  Lizabeth Leyden  Subjective/Objective Assessment:   admitted with diabetic toe/ cellulitis     Action/Plan:   need PCP  assistance with medications   Anticipated DC Date:  02/23/2013   Anticipated DC Plan:  [place]  CM consult  [facility]  Medication Assistance      Choice offered to / List presented to:             Status of service:  Completed, signed off Medicare Important Message given?   (If response is "NO", the following Medicare IM given date fields will be blank) Date Medicare IM given:   Date Additional Medicare IM given:    Discharge Disposition:  HOME/SELF CARE  Per UR Regulation:    If discussed at Long Length of Stay Meetings, dates discussed:    Comments:  02/23/2013  [place], [place] CM referral:  PCP, medication assistance  02/23/2013  [place], [place] Met with patient and interpreter. She explained to patient the [facility] program for his prescriptions at time of discharge, one time program. Provided the contact information for the [facility], explained the registration process.  Met with patient and son to discuss post hospital follow up. Patient and son do no speak [place], NCM will f/u with interpreter.  PCP:  [facility]    [phone] [place], [place]  called to schedule an appointment for patient. Patient needs to go to clinic to register first.  Bring proof of income, photo ID, bill with home address. Appointments available in 1-2 weeks.

## 2013-02-23 NOTE — Discharge Instructions (Signed)
·   Thank you for allowing us to be involved in your healthcare while you were hospitalized at [facility].  °· Please note that there have been changes to your home medications.  --> PLEASE LOOK AT YOUR DISCHARGE MEDICATION LIST FOR DETAILS. ° °· Please call your PCP if you have any questions or concerns, or any difficulty getting any of your medications. °· Please return to the ER if you have worsening of your symptoms or new severe symptoms arise. °·  °

## 2013-02-23 NOTE — Progress Notes (Signed)
With the help of a face-to-face Spanish interpreter Marcella Dubs(Yolilmil Perez) Patient discharge teaching given, diabetic teaching, including activity, diet, follow-up appoints, glucose meter and medications (and match letter). Patient verbalized understanding of all discharge instructions. IV access was d/c'd. Vitals are stable. Skin is intact except as charted in most recent assessments. Pt to be escorted out by NT, to be driven home by family.  Peri MarisAndrew Raylee Adamec, MBA, BS, RN

## 2013-02-23 NOTE — Consult Note (Signed)
WOC wound consult note Reason for Consult: right 5th toe lateral, ulceration.  Reviewed xray, medical team is not concerned.  Hx of ill fitting shoe from report in ED Wound type: neuropathic ulceration  Measurement: 0.5cm x0.5cm x0.2cm  Wound bed: minimally open Drainage (amount, consistency, odor) none Periwound: intact Dressing procedure/placement/frequency: triple antibiotic ointment daily, cover with bandaid.  Apply daily.  Discussed POC with patient and bedside nurse.  Re consult if needed, will not follow at this time. Thanks  Tisa Weisel [employer] RN, CWOCN [phone])

## 2013-02-23 NOTE — Progress Notes (Addendum)
Inpatient Diabetes Program Recommendations  AACE/ADA: New Consensus Statement on Inpatient Glycemic Control (2013)  Target Ranges:  Prepandial:   less than 140 mg/dL      Peak postprandial:   less than 180 mg/dL (1-2 hours)      Critically ill patients:  140 - 180 mg/dL    **Admitted with foot ulcer.  Has history of Type 2 DM X 10 years.  [place] seen in [place] in [place] and was given Lantus insulin pen about 7 months ago.  Does not have PCP and does not seek regular medical care.  Spanish speaking only and does not have health insurance coverage.  **Spoke with patient using assistance of Engineer, structuralpanish translator.  Discussed how to give insulin injections, rotation of insulin injection sites, importance of checking CBGs tid ac and recording all CBG results, importance of seeking regular medical care.  Reviewed s/sxs of both hyperglycemia and hypoglycemia and how to treat at home.  Also discussed importance of monitoring carbohydrate intake.  Encouraged patient to avoid beverages with sugar.  Encouraged patient to purchase a CBG meter OTC at [employer] (pamphlet given with information) and instructed patient to check his CBGs tid before meals.  RD in to see patient as well to discuss carbohydrates at home.  **Reviewed 70/30 insulin with patient.  Reminded patient to make sure he eats a meal when taking this insulin.  RN to review drawing up and administering insulin with patient before d/c.  Instructed patient to take get [facility] ref[facility] of [facility] at [facility].  70/30 insulin can be purchased for $25 per vial at [facility].  Reminded patient that he will need to discard opened bottle of insulin after 30 days of use and purchase new vial at [facility].    Will follow. Ambrose FinlandJeannine Johnston Ermagene Saidi RN, MSN, CDE Diabetes Coordinator Inpatient Diabetes Program Team Pager: [phone] (8a-10p)

## 2013-02-23 NOTE — Progress Notes (Signed)
Quick Note:  Test result was normal. No further action is needed at this time. ______ 

## 2013-02-23 NOTE — Progress Notes (Signed)
  Date: 02/23/2013  Patient name: Eric Waller  Medical record number: [id number]  Date of birth: 12/13/1955   This patient has been seen and the plan of care was discussed with the house staff. Please see their note for complete details. I concur with their findings with the following additions/corrections:  Patient had presented with purulent DC from his left toe. Plain films had shown gas but his picture had NOT appeared c/w necrotizing fascitis and he has responded well to clindamycin. Exam does not show ulcer that would go to bone overtly though he could have underyling osteomyelitis.   For now will plan on giving him total of 10 days of antibiotics> Clindamycin would be reasonable for covering strep, anerobes, MSSA and MRSA (if no Inducible clinda R present) and he seems to have responded well to it. Other options would be oral augmentin (but would lose idea of Clinda potentiallly active vs MRSA) and high dose amoxicillin (would also lose MSSA coverage)   Randall Hissornelius N [place], MD 02/23/2013, 2:02 PM

## 2013-02-24 NOTE — ED Notes (Signed)
Abscess culture R toe from 1/31: Few Staph species (coagulase neg.).  Pt. was transferred to the ED.  Pt. got Cleocin 2/2 from another provider. Dr. Lorenz CoasterKeller documented on his note, test was normal, no further action needed. Eric Waller, Eric Waller 02/24/2013

## 2013-02-27 NOTE — Discharge Summary (Signed)
  Date: 02/27/2013  Patient name: Eric Waller  Medical record number: [id number]  Date of birth: 12/12/1955   This patient has been seen and the plan of care was discussed with the house staff. Please see their note for complete details. I concur with their findings with the following additions/corrections:  Patient had improved dramatically. He will need MUCH better outpatient close followup to ensure his DM is controlled and that his ulcer does not progress any further. He did Not have obvious osteo during exam and on plain films.  Randall Hissornelius N [place], MD 02/27/2013, 2:34 PM

## 2015-04-20 IMAGING — CR DG FOOT COMPLETE 3+V*R*
3 series · 3 of 3 positions shown · non-contrast
Comparison: None.

CLINICAL DATA: Infected ulcer.

EXAM:
RIGHT FOOT COMPLETE - 3+ VIEW

[view not recorded (1 of 3)]
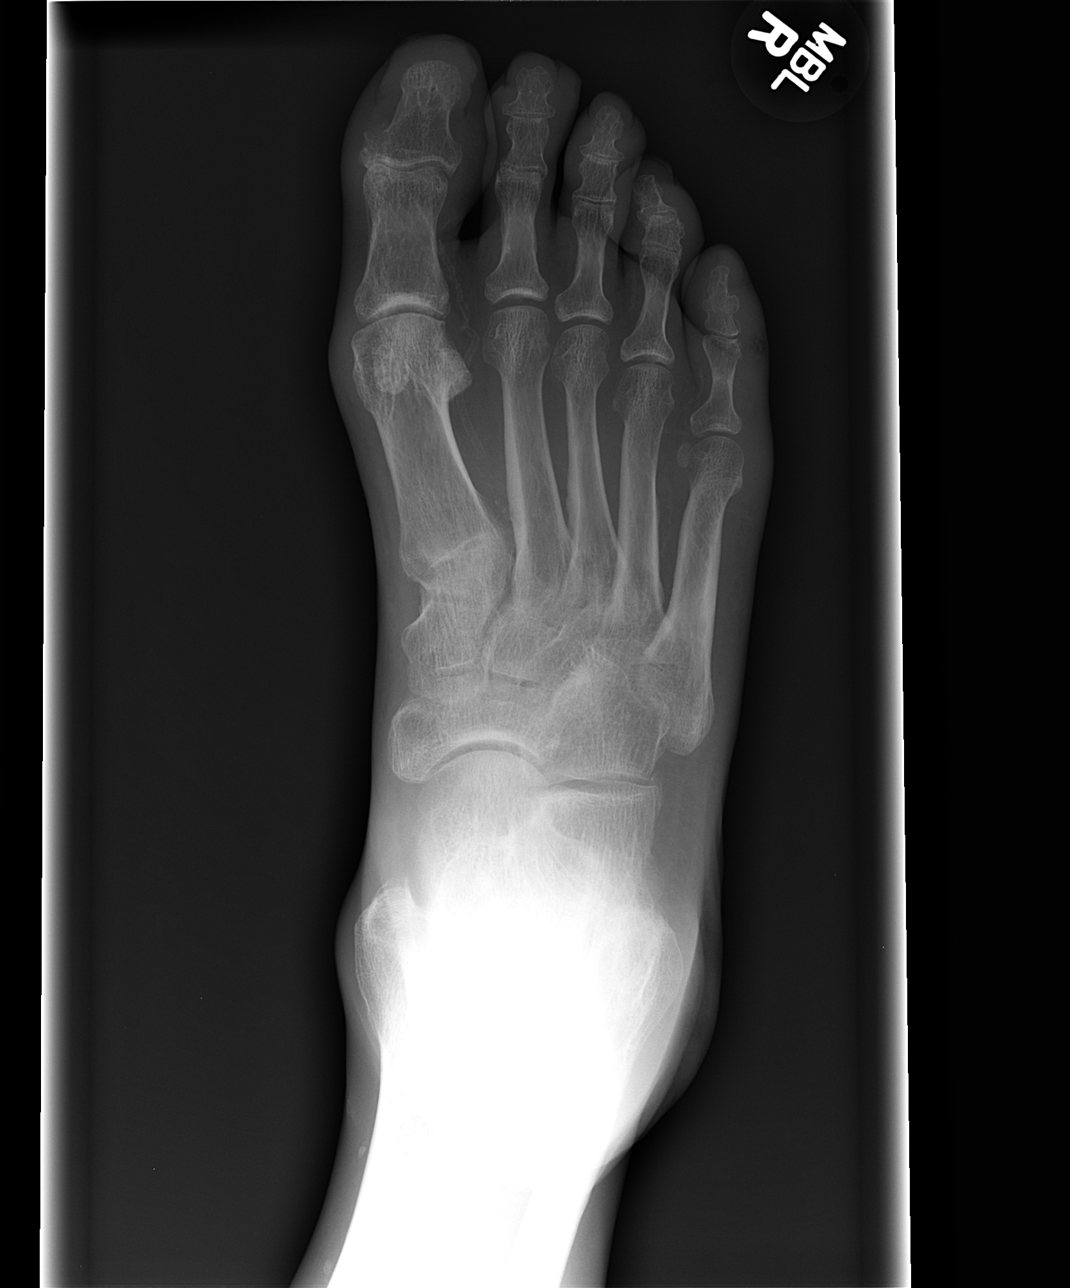

[view not recorded (2 of 3)]
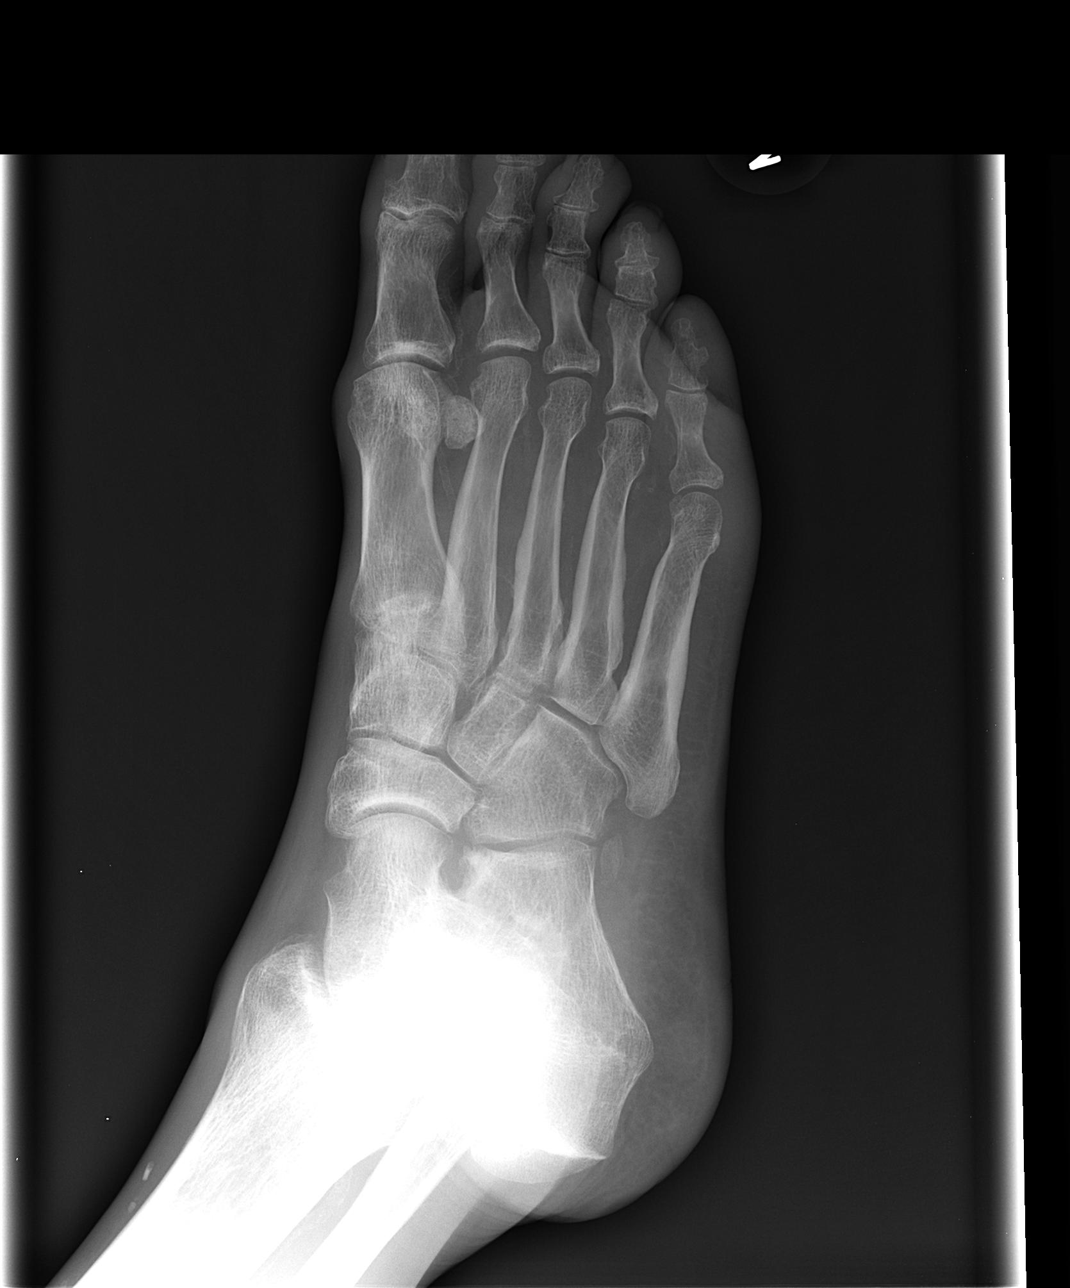

[view not recorded (3 of 3)]
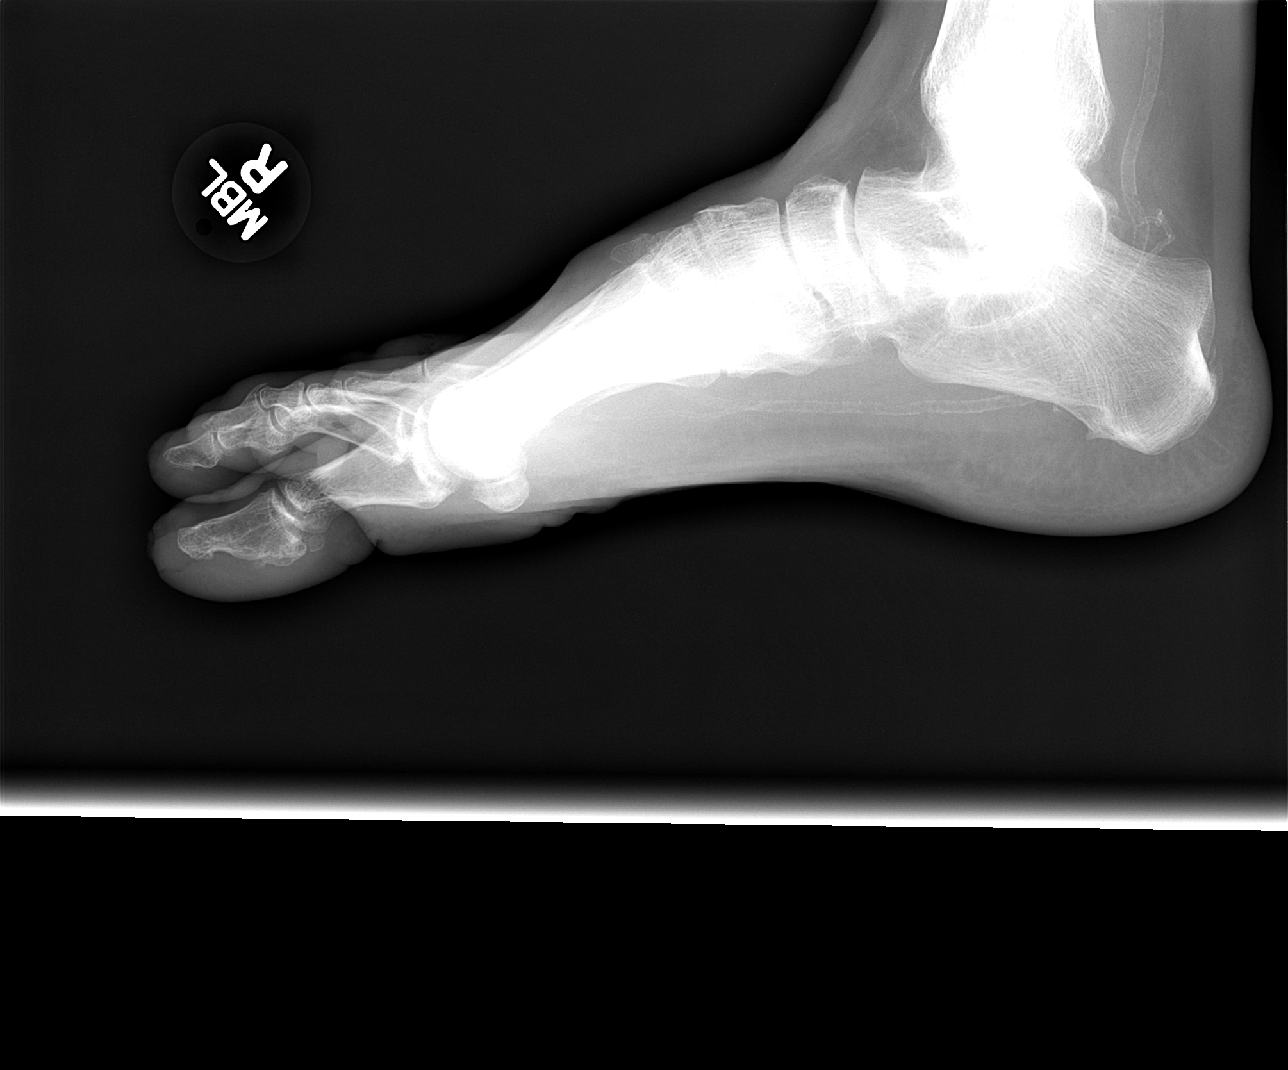

[3 of 3 positions shown; findings below may reference images not displayed]

FINDINGS: Small bubbles of subcutaneous gas noted lateral to the proximal
interphalangeal joint little toe. No cortical destruction or other
bone abnormality. The DIP joint is fused, an anatomic variant.
Normal alignment and mineralization. Extensive arterial vascular
calcifications. No fracture. Small calcaneal spur.
IMPRESSION: 1. Subcutaneous gas lateral to the little toe PIP joint, without
associated bone abnormality.
